# Patient Record
Sex: Male | Born: 1990 | Race: White | Hispanic: No | Marital: Single | Smoking: Current every day smoker
Health system: Southern US, Community
[De-identification: ages and names within clinical notes are randomized; demographics above are authoritative.]

---
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
## 2011-01-07 ENCOUNTER — Emergency Department (HOSPITAL_COMMUNITY)
Admission: EM | Admit: 2011-01-07 | Discharge: 2011-01-07 | Disposition: A | Discharge status: Home / Self Care | Payer: BC Managed Care – PPO | Attending: Emergency Medicine | Admitting: Emergency Medicine

## 2011-01-07 ENCOUNTER — Encounter: Payer: Self-pay | Admitting: *Deleted

## 2011-01-07 ENCOUNTER — Emergency Department (HOSPITAL_COMMUNITY): Payer: BC Managed Care – PPO

## 2011-01-07 DIAGNOSIS — M940 Chondrocostal junction syndrome [Tietze]: Secondary | ICD-10-CM | POA: Insufficient documentation

## 2011-01-07 MED ORDER — IBUPROFEN 800 MG PO TABS
800.0000 mg | ORAL_TABLET | Freq: Once | ORAL | Status: AC
  Administered 2011-01-07: 800 mg via ORAL
  Filled 2011-01-07: qty 1

## 2011-01-07 MED ORDER — KETOROLAC TROMETHAMINE 60 MG/2ML IM SOLN: 60.0000 mg | Freq: Once | INTRAMUSCULAR | Status: DC

## 2011-01-07 MED ORDER — HYDROCOD POLST-CHLORPHEN POLST 10-8 MG/5ML PO LQCR: 5.0000 mL | Freq: Two times a day (BID) | ORAL | Status: DC

## 2011-01-07 MED ORDER — IBUPROFEN 800 MG PO TABS: 800.0000 mg | ORAL_TABLET | Freq: Three times a day (TID) | ORAL | Status: AC | PRN

## 2011-01-07 NOTE — ED Notes (Signed)
C/o cough, chest pain, increased sob, and weakness x 4 days.

## 2011-01-07 NOTE — ED Provider Notes (Signed)
History     CSN: [id number] Arrival date & time: 01/07/2011 11:40 AM  Chief Complaint  Patient presents with  . Cough   Patient is a 20 y.o. male presenting with cough. The history is provided by the patient.  Cough This is a new problem. The current episode started more than 2 days ago. The problem occurs constantly (worse with coughand deep inspiration). The problem has not changed since onset.The cough is non-productive. There has been no fever. Associated symptoms include chest pain and shortness of breath. Pertinent negatives include no chills, no headaches, no sore throat, no myalgias and no wheezing. He has tried nothing for the symptoms. He is not a smoker. His past medical history does not include bronchitis, pneumonia or asthma.    History reviewed. No pertinent past medical history.  History reviewed. No pertinent past surgical history.  No family history on file.  History  Substance Use Topics  . Smoking status: Never Smoker   . Smokeless tobacco: Not on file  . Alcohol Use: No      Review of Systems  Constitutional: Negative for fever and chills.  HENT: Negative for congestion, sore throat and neck pain.   Eyes: Negative.   Respiratory: Positive for cough and shortness of breath. Negative for chest tightness and wheezing.   Cardiovascular: Positive for chest pain.  Gastrointestinal: Negative for nausea and abdominal pain.  Genitourinary: Negative.   Musculoskeletal: Negative for myalgias, joint swelling and arthralgias.  Skin: Negative.  Negative for rash and wound.  Neurological: Negative for dizziness, weakness, light-headedness, numbness and headaches.  Hematological: Negative.   Psychiatric/Behavioral: Negative.     Physical Exam  BP 131/58  Pulse 90  Temp(Src) 98.6 F (37 C) (Oral)  Resp 16  Ht 6\' 4"  (1.93 m)  Wt 155 lb (70.308 kg)  BMI 18.87 kg/m2  SpO2 100%  Physical Exam  Nursing note and vitals reviewed. Constitutional: He is oriented to  person, place, and time. He appears well-developed and well-nourished.  HENT:  Head: Normocephalic and atraumatic.  Eyes: Conjunctivae are normal.  Neck: Normal range of motion.  Cardiovascular: Normal rate, regular rhythm, normal heart sounds and intact distal pulses.   Pulmonary/Chest: Effort normal and breath sounds normal. He has no wheezes. He has no rales. He exhibits tenderness.    Abdominal: Soft. Bowel sounds are normal. There is no tenderness.  Musculoskeletal: Normal range of motion. He exhibits no edema and no tenderness.  Neurological: He is alert and oriented to person, place, and time.  Skin: Skin is warm and dry.  Psychiatric: He has a normal mood and affect.    ED Course  Procedures  MDM Patient with nonproductive cough and left palpable lower ribcage pain,  PERC negative with no risk factors for PE,  Normal cxr.        Candis Musa, PA 01/07/11 1311  Suzi Roots, MD 01/16/11 [phone]

## 2011-01-07 NOTE — ED Notes (Signed)
Pt reports cough, congestion for the past 4 days.  Pt also states that he has been having pain to his left ribs.  Pt had his ribs broken several years ago and states "i haven't been right ever since".  nad noted

## 2011-11-29 ENCOUNTER — Other Ambulatory Visit: Payer: Self-pay | Admitting: Family Medicine

## 2011-11-29 ENCOUNTER — Ambulatory Visit (HOSPITAL_COMMUNITY)
Admission: RE | Admit: 2011-11-29 | Discharge: 2011-11-29 | Disposition: A | Discharge status: Home / Self Care | Payer: BC Managed Care – PPO | Source: Ambulatory Visit | Attending: Family Medicine | Admitting: Family Medicine

## 2011-11-29 DIAGNOSIS — M25529 Pain in unspecified elbow: Secondary | ICD-10-CM | POA: Insufficient documentation

## 2011-11-29 DIAGNOSIS — X58XXXA Exposure to other specified factors, initial encounter: Secondary | ICD-10-CM | POA: Insufficient documentation

## 2011-11-29 DIAGNOSIS — S6990XA Unspecified injury of unspecified wrist, hand and finger(s), initial encounter: Secondary | ICD-10-CM | POA: Insufficient documentation

## 2011-11-29 DIAGNOSIS — S59919A Unspecified injury of unspecified forearm, initial encounter: Secondary | ICD-10-CM | POA: Insufficient documentation

## 2011-11-29 DIAGNOSIS — S59909A Unspecified injury of unspecified elbow, initial encounter: Secondary | ICD-10-CM | POA: Insufficient documentation

## 2017-09-30 ENCOUNTER — Emergency Department (HOSPITAL_COMMUNITY): Payer: Self-pay

## 2017-09-30 ENCOUNTER — Other Ambulatory Visit: Payer: Self-pay

## 2017-09-30 ENCOUNTER — Emergency Department (HOSPITAL_COMMUNITY)
Admission: EM | Admit: 2017-09-30 | Discharge: 2017-10-01 | Disposition: A | Discharge status: Home / Self Care | Payer: Self-pay | Attending: Emergency Medicine | Admitting: Emergency Medicine

## 2017-09-30 ENCOUNTER — Encounter (HOSPITAL_COMMUNITY): Payer: Self-pay

## 2017-09-30 DIAGNOSIS — F17200 Nicotine dependence, unspecified, uncomplicated: Secondary | ICD-10-CM | POA: Insufficient documentation

## 2017-09-30 DIAGNOSIS — S67191A Crushing injury of left index finger, initial encounter: Secondary | ICD-10-CM | POA: Insufficient documentation

## 2017-09-30 DIAGNOSIS — Z23 Encounter for immunization: Secondary | ICD-10-CM | POA: Insufficient documentation

## 2017-09-30 DIAGNOSIS — Y999 Unspecified external cause status: Secondary | ICD-10-CM | POA: Insufficient documentation

## 2017-09-30 DIAGNOSIS — Y929 Unspecified place or not applicable: Secondary | ICD-10-CM | POA: Insufficient documentation

## 2017-09-30 DIAGNOSIS — S61211A Laceration without foreign body of left index finger without damage to nail, initial encounter: Secondary | ICD-10-CM | POA: Insufficient documentation

## 2017-09-30 DIAGNOSIS — Y9389 Activity, other specified: Secondary | ICD-10-CM | POA: Insufficient documentation

## 2017-09-30 DIAGNOSIS — W270XXA Contact with workbench tool, initial encounter: Secondary | ICD-10-CM | POA: Insufficient documentation

## 2017-09-30 MED ORDER — TETANUS-DIPHTH-ACELL PERTUSSIS 5-2.5-18.5 LF-MCG/0.5 IM SUSP
0.5000 mL | Freq: Once | INTRAMUSCULAR | Status: AC
  Administered 2017-09-30: 0.5 mL via INTRAMUSCULAR
  Filled 2017-09-30: qty 0.5

## 2017-09-30 NOTE — ED Notes (Signed)
Patient transported to X-ray 

## 2017-09-30 NOTE — ED Triage Notes (Signed)
Pt reports hitting left index finger with sledgehammer about 2 hours ago. Pt has finger wrapped tight with bandaid and tape. Dressing reinforced in triage as blood was seeping from bandage. Pt reports he could not get wound to stop bleeding. Pt able to move fingers, radial pulse present.

## 2017-09-30 NOTE — ED Notes (Signed)
Crush injury to tip of L index finger  Small lac to lateral surface of finger which is bruised and seeping blood from area of tip of finger  Sensation intact  Pt reports injury at 1700 and he tried to doctor it Would not stop bleeding so came to ED for eval

## 2017-10-01 MED ORDER — LIDOCAINE HCL (PF) 1 % IJ SOLN
5.0000 mL | Freq: Once | INTRAMUSCULAR | Status: AC
  Administered 2017-10-01: 5 mL
  Filled 2017-10-01: qty 6

## 2017-10-01 MED ORDER — TRAMADOL HCL 50 MG PO TABS: 50.0000 mg | ORAL_TABLET | Freq: Four times a day (QID) | ORAL | 0 refills | Status: DC | PRN

## 2017-10-01 MED ORDER — CEPHALEXIN 500 MG PO CAPS: 500.0000 mg | ORAL_CAPSULE | Freq: Four times a day (QID) | ORAL | 0 refills | Status: DC

## 2017-10-01 MED ORDER — NAPROXEN 500 MG PO TABS: 500.0000 mg | ORAL_TABLET | Freq: Two times a day (BID) | ORAL | 0 refills | Status: DC

## 2017-10-01 NOTE — ED Provider Notes (Signed)
[facility] EMERGENCY DEPARTMENT Provider Note   CSN: [id number] Arrival date & time: 09/30/17  2049     History   Chief Complaint Chief Complaint  Patient presents with  . Finger Injury    laceration left index finger    HPI Frederick Prince is a 27 y.o. male who presents to the ED s/p crush injury to the left index finger. Patient reports he was working on a car and hit his left index finger with a sledgehammer. Patient states he tried to get it to stop bleeding at home but it didn't stop so he came in. He is not up to date on his tetanus.  The history is provided by the patient. No language interpreter was used.    History reviewed. No pertinent past medical history.  There are no active problems to display for this patient.   History reviewed. No pertinent surgical history.      Home Medications    Prior to Admission medications   Medication Sig Start Date End Date Taking? Authorizing Provider  cephALEXin (KEFLEX) 500 MG capsule Take 1 capsule (500 mg total) by mouth 4 (four) times daily. 10/01/17   Janne Napoleon, NP  naproxen (NAPROSYN) 500 MG tablet Take 1 tablet (500 mg total) by mouth 2 (two) times daily. 10/01/17   Janne Napoleon, NP  traMADol (ULTRAM) 50 MG tablet Take 1 tablet (50 mg total) by mouth every 6 (six) hours as needed. 10/01/17   Janne Napoleon, NP    Family History No family history on file.  Social History Social History   Tobacco Use  . Smoking status: Current Every Day Smoker    Packs/day: 2.00    Years: 10.00    Pack years: 20.00  . Smokeless tobacco: Former Neurosurgeon    Types: Chew  Substance Use Topics  . Alcohol use: Yes    Comment: occ  . Drug use: No     Allergies   Amoxicillin and Penicillins   Review of Systems Review of Systems  Musculoskeletal: Positive for arthralgias.  Skin: Positive for wound.  All other systems reviewed and are negative.    Physical Exam Updated Vital Signs BP (!) 120/51 (BP Location: Right Arm)    Pulse 72   Temp 98.6 F (37 C) (Oral)   Resp 18   Ht  (1.956 m)   Wt 72.6 kg (160 lb)   SpO2 100%   BMI 18.97 kg/m   Physical Exam  Constitutional: He appears well-developed and well-nourished. No distress.  HENT:  Head: Normocephalic.  Eyes: EOM are normal.  Neck: Neck supple.  Cardiovascular: Normal rate.  Pulmonary/Chest: Effort normal.  Abdominal: Soft. There is no tenderness.  Musculoskeletal:       Left hand: He exhibits tenderness, laceration and swelling. He exhibits normal range of motion and normal capillary refill. Normal sensation noted. Normal strength noted. He exhibits no thumb/finger opposition.  Left index finger with superficial flap laceration to the distal aspect. The flap is so thin it is not suitable for suturing.   Neurological: He is alert.  Skin: Skin is warm and dry.  Psychiatric: He has a normal mood and affect.  Nursing note and vitals reviewed.    ED Treatments / Results  Labs (all labs ordered are listed, but only abnormal results are displayed) Labs Reviewed - No data to display  EKG None  Radiology Dg Finger Index Left  Result Date: 09/30/2017 CLINICAL DATA:  Pain. EXAM: LEFT INDEX FINGER  2+V COMPARISON:  None. FINDINGS: There is a crush injury of the distal phalanx. This involves the tuft. DIP joint appears intact. Soft tissue swelling. IMPRESSION: Crush injury of the distal phalanx. Electronically Signed   By: Elsie Stain M.D.   On: 09/30/2017 22:20    Procedures Irrigation Date/Time: 10/01/2017 1:36 AM Performed by: Janne Napoleon, NP Authorized by: Janne Napoleon, NP  Consent: Verbal consent obtained. Risks and benefits: risks, benefits and alternatives were discussed Consent given by: patient Patient understanding: patient states understanding of the procedure being performed Imaging studies: imaging studies available Required items: required blood products, implants, devices, and special equipment available Patient  identity confirmed: verbally with patient Preparation: Patient was prepped and draped in the usual sterile fashion. Local anesthesia used: yes Anesthesia: local infiltration  Anesthesia: Local anesthesia used: yes Local Anesthetic: lidocaine 1% without epinephrine Anesthetic total: 1 mL  Sedation: Patient sedated: no  Patient tolerance: Patient tolerated the procedure well with no immediate complications Comments: Wound dirty with grease and dirt. Soaked in NSS and cleaned with betadine scrub brush then irrigated with NSS 500 ccs. Wound closed loosely with steri strips, dressing and splint. Tetanus updated.     (including critical care time)  Medications Ordered in ED Medications  Tdap (BOOSTRIX) injection 0.5 mL (0.5 mLs Intramuscular Given 09/30/17 2357)  lidocaine (PF) (XYLOCAINE) 1 % injection 5 mL (5 mLs Infiltration Given 10/01/17 0045)   Dr. Bebe Shaggy in to examine the patient.   Initial Impression / Assessment and Plan / ED Course  I have reviewed the triage vital signs and the nursing notes. 27 y.o. male here with laceration and crush injury to the distal aspect of the left index finger stable for d/c. No focal neuro deficits. X-rays reviewed and results and plan of care discussed with the patient and he agrees with plan. Return precautions discussed. Rx Keflex.   Final Clinical Impressions(s) / ED Diagnoses   Final diagnoses:  Crushing injury of left index finger, initial encounter  Laceration of left index finger without foreign body, nail damage status unspecified, initial encounter    ED Discharge Orders        Ordered    cephALEXin (KEFLEX) 500 MG capsule  4 times daily     10/01/17 0109    naproxen (NAPROSYN) 500 MG tablet  2 times daily     10/01/17 0110    traMADol (ULTRAM) 50 MG tablet  Every 6 hours PRN     10/01/17 0110       Damian Leavell Orleans, NP 10/01/17 0141    Zadie Rhine, MD 10/01/17 [phone]

## 2017-10-01 NOTE — Discharge Instructions (Addendum)
If you develop any signs of infection such as redness, fever, drainage from the wound or other problems return immediately.

## 2018-04-20 ENCOUNTER — Other Ambulatory Visit: Payer: Self-pay

## 2018-04-20 ENCOUNTER — Emergency Department (HOSPITAL_COMMUNITY): Payer: Self-pay

## 2018-04-20 ENCOUNTER — Encounter (HOSPITAL_COMMUNITY): Payer: Self-pay | Admitting: Emergency Medicine

## 2018-04-20 ENCOUNTER — Emergency Department (HOSPITAL_COMMUNITY)
Admission: EM | Admit: 2018-04-20 | Discharge: 2018-04-20 | Disposition: A | Discharge status: Home / Self Care | Payer: Self-pay | Attending: Emergency Medicine | Admitting: Emergency Medicine

## 2018-04-20 DIAGNOSIS — E876 Hypokalemia: Secondary | ICD-10-CM

## 2018-04-20 DIAGNOSIS — F17210 Nicotine dependence, cigarettes, uncomplicated: Secondary | ICD-10-CM | POA: Insufficient documentation

## 2018-04-20 DIAGNOSIS — R091 Pleurisy: Secondary | ICD-10-CM

## 2018-04-20 DIAGNOSIS — Z79899 Other long term (current) drug therapy: Secondary | ICD-10-CM | POA: Insufficient documentation

## 2018-04-20 LAB — CBC
HCT: 45 % (ref 39.0–52.0)
Hemoglobin: 15.2 g/dL (ref 13.0–17.0)
MCH: 30.5 pg (ref 26.0–34.0)
MCHC: 33.8 g/dL (ref 30.0–36.0)
MCV: 90.4 fL (ref 80.0–100.0)
NRBC: 0 % (ref 0.0–0.2)
PLATELETS: 310 10*3/uL (ref 150–400)
RBC: 4.98 MIL/uL (ref 4.22–5.81)
RDW: 11.9 % (ref 11.5–15.5)
WBC: 9.7 10*3/uL (ref 4.0–10.5)

## 2018-04-20 LAB — BASIC METABOLIC PANEL
ANION GAP: 12 (ref 5–15)
BUN: 12 mg/dL (ref 6–20)
CALCIUM: 10 mg/dL (ref 8.9–10.3)
CO2: 21 mmol/L — AB (ref 22–32)
Chloride: 103 mmol/L (ref 98–111)
Creatinine, Ser: 0.99 mg/dL (ref 0.61–1.24)
Glucose, Bld: 127 mg/dL — ABNORMAL HIGH (ref 70–99)
Potassium: 3 mmol/L — ABNORMAL LOW (ref 3.5–5.1)
Sodium: 136 mmol/L (ref 135–145)

## 2018-04-20 LAB — TROPONIN I: Troponin I: <0.03 ng/mL (ref <0.03)

## 2018-04-20 MED ORDER — MELOXICAM 7.5 MG PO TABS: 7.5000 mg | ORAL_TABLET | Freq: Two times a day (BID) | ORAL | 0 refills | Status: AC

## 2018-04-20 MED ORDER — KETOROLAC TROMETHAMINE 30 MG/ML IJ SOLN
30.0000 mg | Freq: Once | INTRAMUSCULAR | Status: AC
  Administered 2018-04-20: 30 mg via INTRAVENOUS
  Filled 2018-04-20: qty 1

## 2018-04-20 MED ORDER — POTASSIUM CHLORIDE CRYS ER 20 MEQ PO TBCR
40.0000 meq | EXTENDED_RELEASE_TABLET | Freq: Once | ORAL | Status: AC
Start: 2018-04-20 — End: 2018-04-20
  Administered 2018-04-20: 40 meq via ORAL
  Filled 2018-04-20: qty 2

## 2018-04-20 MED ORDER — IOPAMIDOL (ISOVUE-370) INJECTION 76%
100.0000 mL | Freq: Once | INTRAVENOUS | Status: AC | PRN
  Administered 2018-04-20: 100 mL via INTRAVENOUS

## 2018-04-20 MED ORDER — POTASSIUM CHLORIDE ER 10 MEQ PO TBCR: 10.0000 meq | EXTENDED_RELEASE_TABLET | Freq: Every day | ORAL | 0 refills | Status: AC

## 2018-04-20 NOTE — ED Provider Notes (Signed)
[facility] EMERGENCY DEPARTMENT Provider Note   CSN: [id number] Arrival date & time: 04/20/18  1606     History   Chief Complaint Chief Complaint  Patient presents with  . Chest Pain    HPI Frederick Prince is a 27 y.o. male.  HPI  27 year old male, denies any prior cardiac medical history or any medical history at all.  He states that he does smoke cigarettes and drinks alcohol, he has not smoked marijuana in a couple of months.  The patient reports that starting about 36 hours ago he developed a sharp and stabbing pain in the left side of his chest associated with some right hand numbness and tingling in a pain that goes to his back as well as down his legs.  He has never had anything like this, he does feel a bit short of breath and the pain is worse with taking a deep breath, it is also worse with certain positions.  He has never had any exertional symptoms, has never had any exertional dyspnea or weakness and he is not nauseated vomiting or having diarrhea.  There is been no swelling, injuries, recent surgery or long travel and the patient is not at risk for pulmonary embolism or having any fevers or productive cough to suggest pneumonia.  He has had no medications prior to arrival.  Symptoms are intermittent and at this time they are moderate.  History reviewed. No pertinent past medical history.  There are no active problems to display for this patient.   History reviewed. No pertinent surgical history.      Home Medications    Prior to Admission medications   Medication Sig Start Date End Date Taking? Authorizing Provider  ibuprofen (ADVIL,MOTRIN) 200 MG tablet Take 800 mg by mouth every 6 (six) hours as needed.   Yes [provider]  naproxen sodium (ALEVE) 220 MG tablet Take 440 mg by mouth daily as needed.   Yes [provider]  meloxicam (MOBIC) 7.5 MG tablet Take 1 tablet (7.5 mg total) by mouth 2 (two) times daily for 10 days. 04/20/18 04/30/18   Eber Hong, MD  potassium chloride (K-DUR) 10 MEQ tablet Take 1 tablet (10 mEq total) by mouth daily for 7 days. 04/20/18 04/27/18  Eber Hong, MD    Family History History reviewed. No pertinent family history.  Social History Social History   Tobacco Use  . Smoking status: Current Every Day Smoker    Packs/day: 2.00    Years: 10.00    Pack years: 20.00  . Smokeless tobacco: Former Neurosurgeon    Types: Chew  Substance Use Topics  . Alcohol use: Yes    Comment: occ  . Drug use: No     Allergies   Amoxicillin and Penicillins   Review of Systems Review of Systems  All other systems reviewed and are negative.    Physical Exam Updated Vital Signs BP 126/74   Pulse 85   Temp 97.8 F (36.6 C) (Oral)   Resp 17   Ht 1.956 m (6\' 5" )   Wt 68 kg   SpO2 100%   BMI 17.79 kg/m   Physical Exam  Constitutional: He appears well-developed and well-nourished. No distress.  Though not distressed the patient does appear uncomfortable  HENT:  Head: Normocephalic and atraumatic.  Mouth/Throat: Oropharynx is clear and moist. No oropharyngeal exudate.  Eyes: Pupils are equal, round, and reactive to light. Conjunctivae and EOM are normal. Right eye exhibits no discharge. Left eye exhibits  no discharge. No scleral icterus.  Neck: Normal range of motion. Neck supple. No JVD present. No thyromegaly present.  There is no lymphadenopathy of the neck, there is no torticollis or trismus  Cardiovascular: Regular rhythm, normal heart sounds and intact distal pulses. Exam reveals no gallop and no friction rub.  No murmur heard. The heart rate is approximately 105 bpm, the pulses at the bilateral radial arteries are equal and palpable without any pulse deficit.  I do not hear any murmurs rubs or gallops.  Pulmonary/Chest: Effort normal and breath sounds normal. No respiratory distress. He has no wheezes. He has no rales.  There is equal lung sounds bilaterally without any increased or  decreased lung sounds.  There is no wheezing rhonchi or rales, he speaks in full sentences with no distress or stridor  Abdominal: Soft. Bowel sounds are normal. He exhibits no distension and no mass. There is no tenderness.  Soft and nontender abdomen  Musculoskeletal: Normal range of motion. He exhibits no edema or tenderness.  The patient has no edema of the lower extremities  Lymphadenopathy:    He has no cervical adenopathy.  Neurological: He is alert. Coordination normal.  He reports that he has normal sensation to my exam to bilateral lower extremities as well as bilateral upper extremities.  His strength is normal in all 4 extremities and his gait is normal, speech is clear and cranial nerves III through XII are normal  Skin: Skin is warm and dry. No rash noted. No erythema.  There is no rashes to the skin  Psychiatric: He has a normal mood and affect. His behavior is normal.  Nursing note and vitals reviewed.    ED Treatments / Results  Labs (all labs ordered are listed, but only abnormal results are displayed) Labs Reviewed  BASIC METABOLIC PANEL - Abnormal; Notable for the following components:      Result Value   Potassium 3.0 (*)    CO2 21 (*)    Glucose, Bld 127 (*)    All other components within normal limits  CBC  TROPONIN I    EKG EKG Interpretation  Date/Time:  Thursday April 20 2018 16:15:50 EST Ventricular Rate:  110 PR Interval:    QRS Duration: 107 QT Interval:  332 QTC Calculation: 450 R Axis:   87 Text Interpretation:  Sinus tachycardia Right atrial enlargement Probable inferior infarct, old Anterolateral Q wave, probably normal for age No old tracing to compare Confirmed by Eber Hong ([id number]) on 04/20/2018 4:17:56 PM   Radiology Dg Chest 2 View  Result Date: 04/20/2018 CLINICAL DATA:  Chest pain EXAM: CHEST - 2 VIEW COMPARISON:  01/07/2011 chest radiograph. FINDINGS: Stable cardiomediastinal silhouette with normal heart size. No  pneumothorax. No pleural effusion. Lungs appear clear, with no acute consolidative airspace disease and no pulmonary edema. IMPRESSION: No active cardiopulmonary disease. Electronically Signed   By: Delbert Phenix M.D.   On: 04/20/2018 17:02   Ct Angio Chest/abd/pel For Dissection W And/or Wo Contrast  Result Date: 04/20/2018 CLINICAL DATA:  Left-sided chest pain for 2 days EXAM: CT ANGIOGRAPHY CHEST, ABDOMEN AND PELVIS TECHNIQUE: Multidetector CT imaging through the chest, abdomen and pelvis was performed using the standard protocol during bolus administration of intravenous contrast. Multiplanar reconstructed images and MIPs were obtained and reviewed to evaluate the vascular anatomy. CONTRAST:  ISOVUE-370 IOPAMIDOL (ISOVUE-370) INJECTION 76% COMPARISON:  Chest x-ray 04/20/2018 FINDINGS: CTA CHEST FINDINGS Cardiovascular: Non contrasted images of the chest demonstrate no intramural hematoma. No aneurysm.  No dissection. Normal 3 vessel origin from left arch. Normal heart size. No pericardial effusion Mediastinum/Nodes: No enlarged mediastinal, hilar, or axillary lymph nodes. Thyroid gland, trachea, and esophagus demonstrate no significant findings. Lungs/Pleura: No acute consolidation or effusion. No pneumothorax. 3 mm right middle lobe pulmonary nodule, likely postinfectious or postinflammatory. Musculoskeletal: No chest wall abnormality. No acute or significant osseous findings. Review of the MIP images confirms the above findings. CTA ABDOMEN AND PELVIS FINDINGS VASCULAR Aorta: Normal caliber aorta without aneurysm, dissection, vasculitis or significant stenosis. Celiac: Patent without evidence of aneurysm, dissection, vasculitis or significant stenosis. SMA: Patent without evidence of aneurysm, dissection, vasculitis or significant stenosis. Renals: Single right and single left renal arteries are within normal limits IMA: Patent without evidence of aneurysm, dissection, vasculitis or significant  stenosis. Inflow: Patent without evidence of aneurysm, dissection, vasculitis or significant stenosis. Veins: No obvious venous abnormality within the limitations of this arterial phase study. Review of the MIP images confirms the above findings. NON-VASCULAR Hepatobiliary: No focal liver abnormality is seen. No gallstones, gallbladder wall thickening, or biliary dilatation. Pancreas: Unremarkable. No pancreatic ductal dilatation or surrounding inflammatory changes. Spleen: Normal in size without focal abnormality. Adrenals/Urinary Tract: Adrenal glands are unremarkable. Kidneys are normal, without renal calculi, focal lesion, or hydronephrosis. Bladder is unremarkable. Stomach/Bowel: Stomach is within normal limits. Appendix appears normal. No evidence of bowel wall thickening, distention, or inflammatory changes. Lymphatic: No significantly enlarged lymph nodes Reproductive: Prostate is unremarkable. Other: Negative for free air or free fluid Musculoskeletal: No acute or significant osseous findings. Review of the MIP images confirms the above findings. IMPRESSION: 1. Negative for acute aortic dissection or aneurysm. Although not specifically tailored for assessment of acute pulmonary embolus, no obvious filling defect is visible within the central pulmonary vessels. 2. No acute pulmonary infiltrate or effusion 3. No significant vascular disease within the abdomen or pelvis 4. No CT evidence for acute intra-abdominal or pelvic abnormality Electronically Signed   By: Jasmine PangKim  Fujinaga M.D.   On: 04/20/2018 19:12    Procedures Procedures (including critical care time)  Medications Ordered in ED Medications  potassium chloride SA (K-DUR,KLOR-CON) CR tablet 40 mEq (has no administration in time range)  ketorolac (TORADOL) 30 MG/ML injection 30 mg (30 mg Intravenous Given 04/20/18 1643)  iopamidol (ISOVUE-370) 76 % injection 100 mL (100 mLs Intravenous Contrast Given 04/20/18 1835)     Initial Impression /  Assessment and Plan / ED Course  I have reviewed the triage vital signs and the nursing notes.  Pertinent labs & imaging results that were available during my care of the patient were reviewed by me and considered in my medical decision making (see chart for details).  Clinical Course as of Apr 20 1922  Thu Apr 20, 2018  1803 Chest x-ray is totally negative without any signs of pneumothorax, potassium is 3.0, this will be replaced, CT angiogram for dissection will be ordered   [BM]    Clinical Course User Index [BM] Eber HongMiller, Shelbylynn Walczyk, MD    The patient's EKG shows a sinus tachycardia at a rate of 110 with a normal axis, normal intervals and no signs of ST elevation or depression.  The patient's symptoms do raise concern for several different etiologies, pericarditis and acute coronary syndromes are much less likely, pneumothorax or possibly an aortic dissection or more likely.  Strangely the patient is hypertensive at the age of 27 and with the tachycardia the pain radiating to the back as well as numbness sensation in his right arm  it is concerning for potential dissection.  We will start with x-ray to rule out pneumothorax, Toradol given.  CT negative Pt improved VS normalized other than mild htn.  Vitals:   04/20/18 1615 04/20/18 1847 04/20/18 1855 04/20/18 1900  BP: (!) 149/91 139/81  126/74  Pulse: (!) 110 85    Resp: 20 18 15 17   Temp: 97.8 F (36.6 C)     TempSrc: Oral     SpO2: 100% 100% 100% 100%  Weight:      Height:         Final Clinical Impressions(s) / ED Diagnoses   Final diagnoses:  Pleurisy    ED Discharge Orders         Ordered    meloxicam (MOBIC) 7.5 MG tablet  2 times daily     04/20/18 1922    potassium chloride (K-DUR) 10 MEQ tablet  Daily     04/20/18 Gayla Doss, MD 04/20/18 1923

## 2018-04-20 NOTE — Discharge Instructions (Signed)
Your testing is normal - there is no signs of collapsed lungs, no signs of pneumonia or heart disease Please take mobic twice daily as needed for pain for up to 10 days ER for worsening symptoms.  Otherwise see your doctor in the next week for recheck.    [facility]    Kari BaarsEdward Hawkins MD. Specialty: Pulmonary Disease Contact information: [address]  [address]  [place] [address]  [phone]   Syliva OvermanMargaret Simpson, MD. Specialty: [facility] Contact information: [address]  [place] [address]  [phone]   Lilyan PuntScott Luking, MD. Specialty: [facility] Contact information: [address]  [address]  [place] [address]  [phone]   Avon Gullyesfaye Fanta, MD Specialty: Internal Medicine Contact information: [address]  [place] [address]  [phone]   Catalina PizzaZach Hall, MD. Specialty: Internal Medicine Contact information: [address]  [place] [address]  [phone]    [facility] (Dr. Selena BattenKim) Specialty: Family Medicine Contact information: [address]  [place] [address]  [phone]   John GiovanniStephen Knowlton, MD. Specialty: [facility] Contact information: [address]  [address]  [place] [address]  [phone]   Carylon Perchesoy Fagan, MD. Specialty: Internal Medicine Contact information: [address]  [address]  [place] [address]  [phone]    [facility]  [address] [place] [address] [phone]  Services [facility] offers a variety of basic health services.  Services include but are not limited to: Blood pressure checks  Heart rate checks  Blood sugar checks  Urine analysis  Rapid strep tests  Pregnancy tests.  Health education and referrals  People needing more complex services will be directed to a physician online. Using these virtual visits, doctors can evaluate and  prescribe medicine and treatments. There will be no medication on-site, though [facility] will help patients fill their prescriptions at little to no cost.   For More information please go to: [email or web address]

## 2018-04-20 NOTE — ED Notes (Signed)
Pt works as Financial tradermanager of [employer]  He has had CP and malaise since Tuesday   Has smoked  PPD for 20 years  Resting quietly , NAD

## 2018-04-20 NOTE — ED Triage Notes (Signed)
Pt c/o left sided chest pain that radiates to left arm x 2 days.

## 2019-08-19 IMAGING — CT CT ANGIO CHEST-ABD-PELV FOR DISSECTION W/ AND WO/W CM
2 of 7 series · 13 of 46 positions shown, 15 images · IV contrast (Isovue)
Comparison: Chest x-ray 04/20/2018

CLINICAL DATA: Left-sided chest pain for 2 days

EXAM:
CT ANGIOGRAPHY CHEST, ABDOMEN AND PELVIS
TECHNIQUE: Multidetector CT imaging through the chest, abdomen and pelvis was
performed using the standard protocol during bolus administration of
intravenous contrast. Multiplanar reconstructed images and MIPs were
obtained and reviewed to evaluate the vascular anatomy.
CONTRAST:  100mL LLOEM6-23P IOPAMIDOL (LLOEM6-23P) INJECTION 76%

[Series 5: dissection axial arterial · axial · arterial · 0.62mm/px · z∈[-400,+185]mm · 10 of 229 slices shown, 12 images]
[im 17/229  soft-tissue]
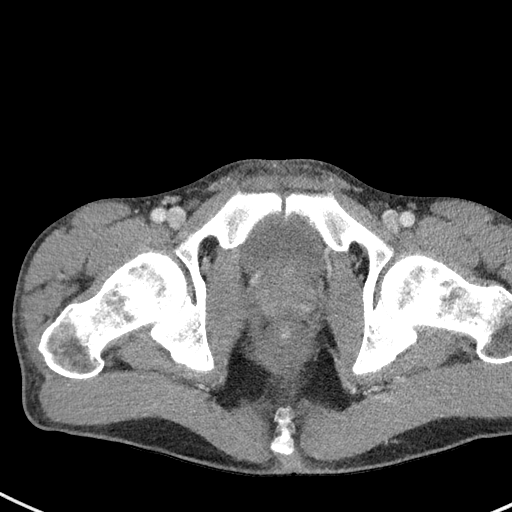
[im 17/229  bone]
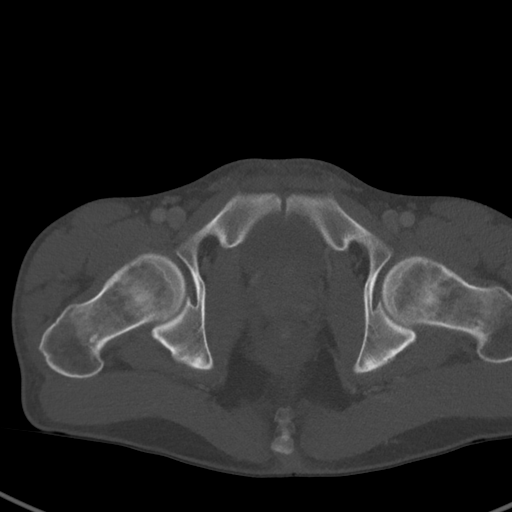
[im 33/229  soft-tissue]
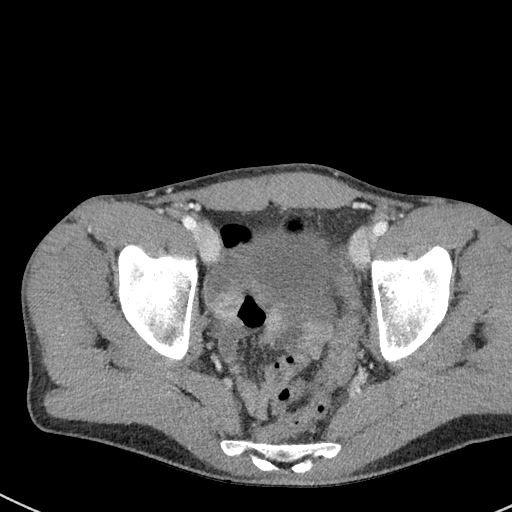
[im 66/229  soft-tissue]
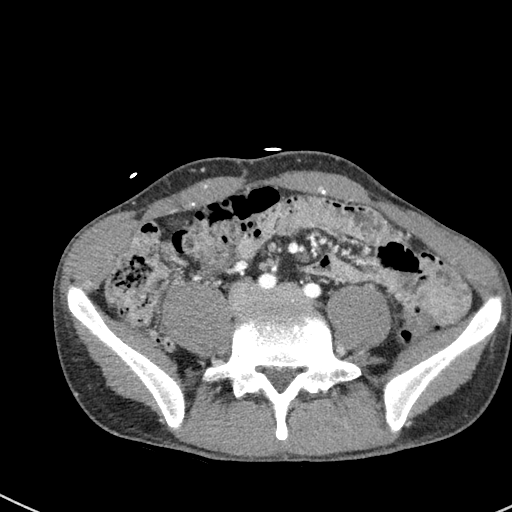
[im 82/229  soft-tissue]
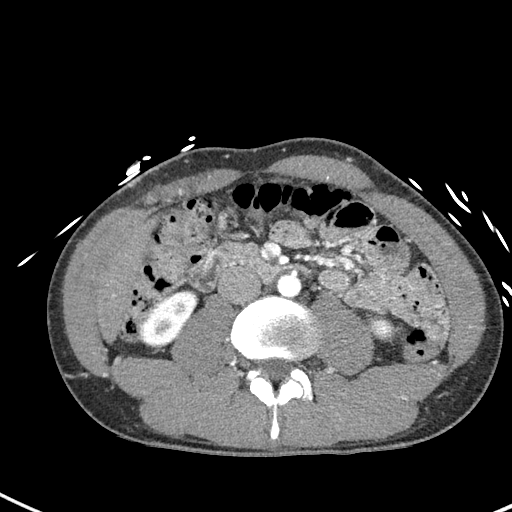
[im 98/229  soft-tissue]
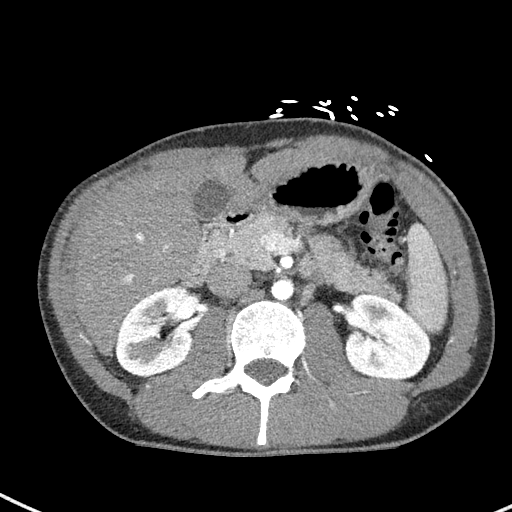
[im 131/229  soft-tissue]
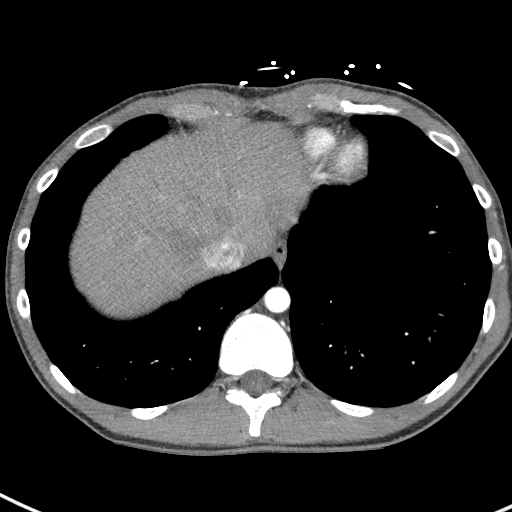
[im 147/229  soft-tissue]
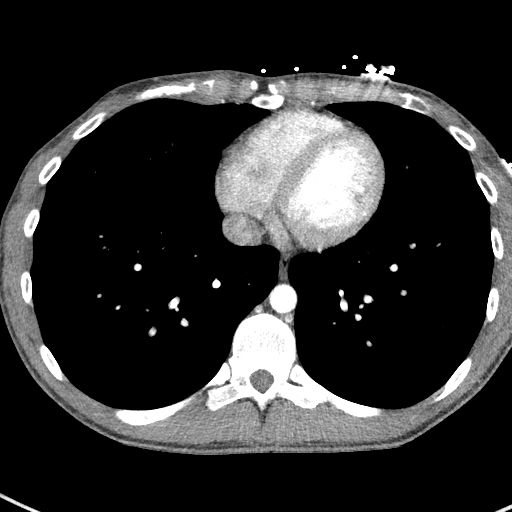
[im 163/229  soft-tissue]
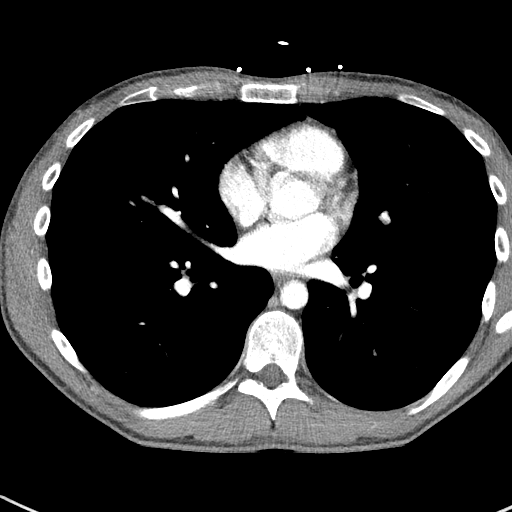
[im 196/229  soft-tissue]
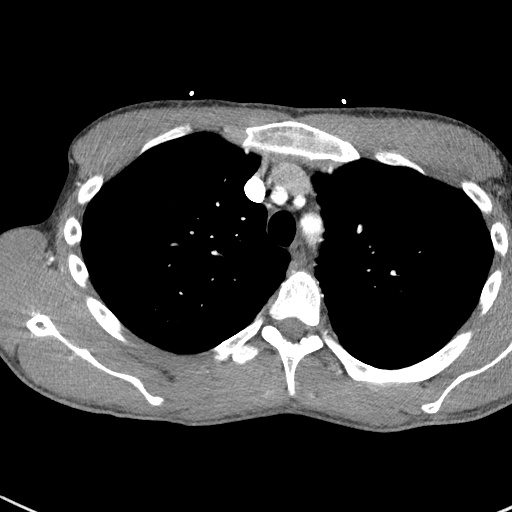
[im 196/229  bone]
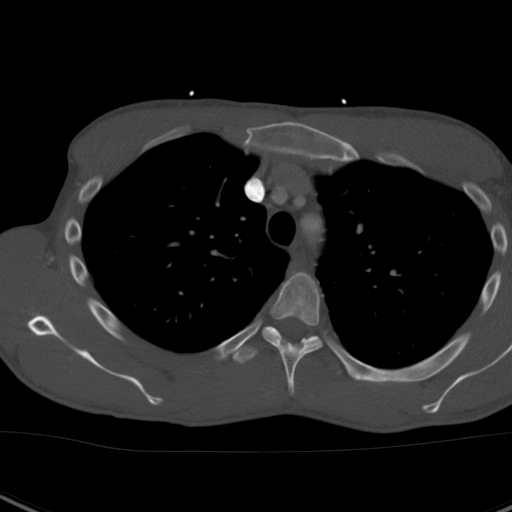
[im 212/229  soft-tissue]
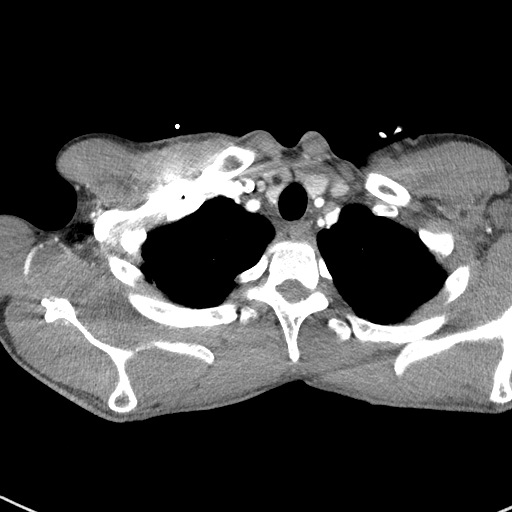

[Series 9: coronals · coronal · 0.70mm/px · 3 of 162 slices shown]
[im 41/162  soft-tissue]
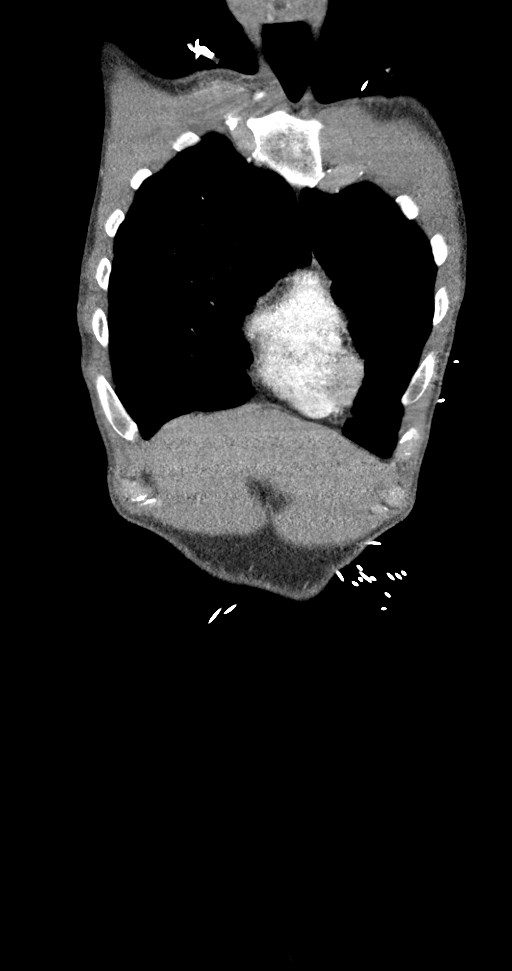
[im 81/162  soft-tissue]
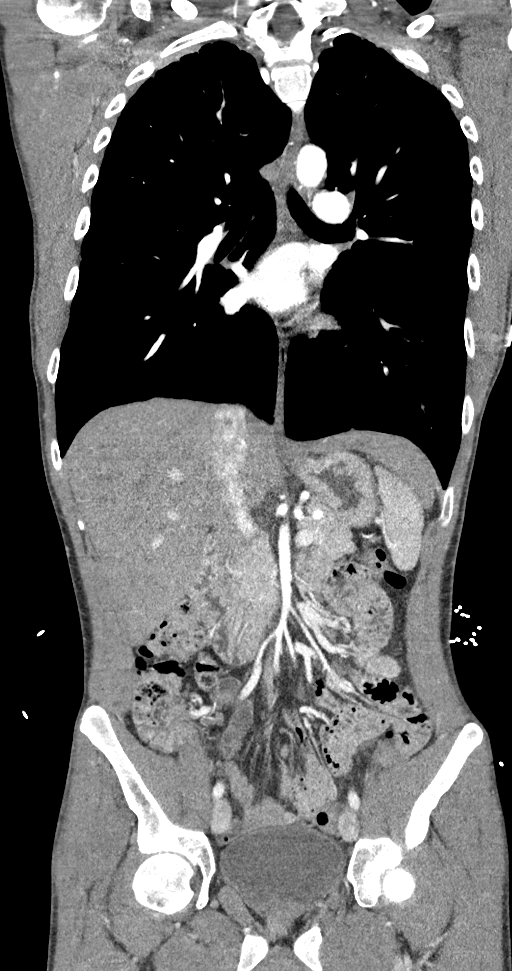
[im 121/162  soft-tissue]
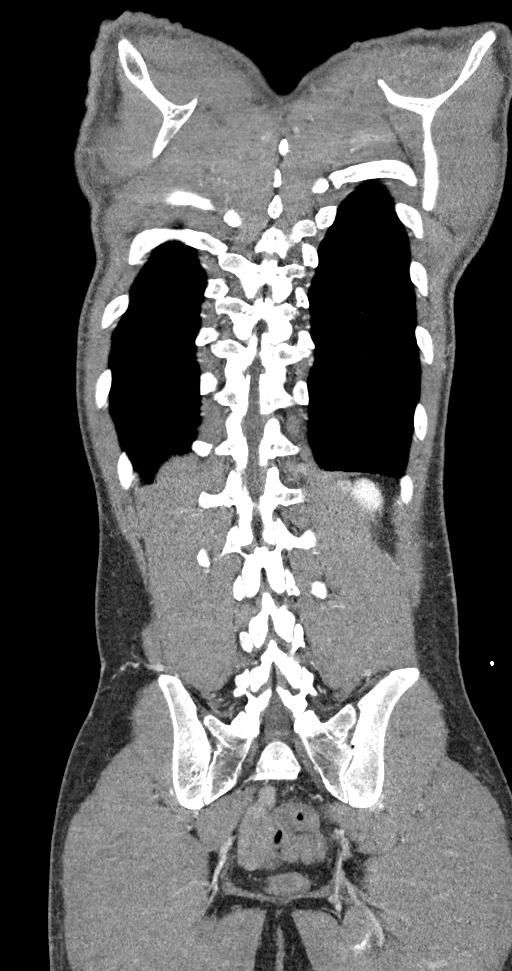

[13 of 46 positions shown; findings below may reference images not displayed]

FINDINGS: CTA CHEST FINDINGS

Cardiovascular: Non contrasted images of the chest demonstrate no
intramural hematoma. No aneurysm. No dissection. Normal 3 vessel
origin from left arch. Normal heart size. No pericardial effusion

Mediastinum/Nodes: No enlarged mediastinal, hilar, or axillary lymph
nodes. Thyroid gland, trachea, and esophagus demonstrate no
significant findings.

Lungs/Pleura: No acute consolidation or effusion. No pneumothorax. 3
mm right middle lobe pulmonary nodule, likely postinfectious or
postinflammatory.

Musculoskeletal: No chest wall abnormality. No acute or significant
osseous findings.

Review of the MIP images confirms the above findings.

CTA ABDOMEN AND PELVIS FINDINGS

VASCULAR

Aorta: Normal caliber aorta without aneurysm, dissection, vasculitis
or significant stenosis.

Celiac: Patent without evidence of aneurysm, dissection, vasculitis
or significant stenosis.

SMA: Patent without evidence of aneurysm, dissection, vasculitis or
significant stenosis.

Renals: Single right and single left renal arteries are within
normal limits

IMA: Patent without evidence of aneurysm, dissection, vasculitis or
significant stenosis.

Inflow: Patent without evidence of aneurysm, dissection, vasculitis
or significant stenosis.

Veins: No obvious venous abnormality within the limitations of this
arterial phase study.

Review of the MIP images confirms the above findings.

NON-VASCULAR

Hepatobiliary: No focal liver abnormality is seen. No gallstones,
gallbladder wall thickening, or biliary dilatation.

Pancreas: Unremarkable. No pancreatic ductal dilatation or
surrounding inflammatory changes.

Spleen: Normal in size without focal abnormality.

Adrenals/Urinary Tract: Adrenal glands are unremarkable. Kidneys are
normal, without renal calculi, focal lesion, or hydronephrosis.
Bladder is unremarkable.

Stomach/Bowel: Stomach is within normal limits. Appendix appears
normal. No evidence of bowel wall thickening, distention, or
inflammatory changes.

Lymphatic: No significantly enlarged lymph nodes

Reproductive: Prostate is unremarkable.

Other: Negative for free air or free fluid

Musculoskeletal: No acute or significant osseous findings.

Review of the MIP images confirms the above findings.
IMPRESSION: 1. Negative for acute aortic dissection or aneurysm. Although not
specifically tailored for assessment of acute pulmonary embolus, no
obvious filling defect is visible within the central pulmonary
vessels.
2. No acute pulmonary infiltrate or effusion
3. No significant vascular disease within the abdomen or pelvis
4. No CT evidence for acute intra-abdominal or pelvic abnormality
